# Patient Record
Sex: Male | Born: 1945 | Race: Black or African American | Hispanic: No | Marital: Married | Smoking: Never smoker
Health system: Southern US, Community
[De-identification: ages and names within clinical notes are randomized; demographics above are authoritative.]

## PROBLEM LIST (undated history)

## (undated) DIAGNOSIS — E079 Disorder of thyroid, unspecified: Secondary | ICD-10-CM

## (undated) DIAGNOSIS — I10 Essential (primary) hypertension: Secondary | ICD-10-CM

## (undated) DIAGNOSIS — E119 Type 2 diabetes mellitus without complications: Secondary | ICD-10-CM

---

## 2018-12-03 ENCOUNTER — Ambulatory Visit
Admission: EM | Admit: 2018-12-03 | Discharge: 2018-12-03 | Disposition: A | Discharge status: Home / Self Care | Payer: Medicare Other | Attending: Physician Assistant | Admitting: Physician Assistant

## 2018-12-03 ENCOUNTER — Other Ambulatory Visit: Payer: Self-pay

## 2018-12-03 DIAGNOSIS — S86112A Strain of other muscle(s) and tendon(s) of posterior muscle group at lower leg level, left leg, initial encounter: Secondary | ICD-10-CM

## 2018-12-03 HISTORY — DX: Essential (primary) hypertension: I10

## 2018-12-03 HISTORY — DX: Disorder of thyroid, unspecified: E07.9

## 2018-12-03 HISTORY — DX: Type 2 diabetes mellitus without complications: E11.9

## 2018-12-03 MED ORDER — TIZANIDINE HCL 4 MG PO TABS: 4.0000 mg | ORAL_TABLET | Freq: Two times a day (BID) | ORAL | 0 refills | Status: AC | PRN

## 2018-12-03 MED ORDER — MELOXICAM 7.5 MG PO TABS: 7.5000 mg | ORAL_TABLET | Freq: Every day | ORAL | 0 refills | Status: AC

## 2018-12-03 NOTE — Discharge Instructions (Signed)
Start Mobic. Do not take ibuprofen (motrin/advil)/ naproxen (aleve) while on mobic. Tizanidine as needed, this can make you drowsy, so do not take if you are going to drive, operate heavy machinery, or make important decisions. Ice/heat compresses as needed. This can take up to 3-4 weeks to completely resolve, but you should be feeling better each week. Follow up with PCP if symptoms worsen, changes for reevaluation. If experience numbness/tingling of the inner thighs, loss of bladder or bowel control, go to the emergency department for evaluation. If experiencing swelling to one leg, redness, warmth, increased pain, chest pain, shortness of breath, go to the ED for further evaluation needed.

## 2018-12-03 NOTE — ED Triage Notes (Signed)
Pt c/o lt leg pain x1wk, states started in lower back and moved down lt leg, worse at night. Denies injury. States came from [place] a month ago, 10hr car ride.

## 2018-12-03 NOTE — ED Provider Notes (Signed)
[name]    CSN: [id number] Arrival date & time: 12/03/18  1153     History   Chief Complaint Chief Complaint  Patient presents with  . Leg Pain    HPI Joel Coleman is a 73 y.o. male.   73 year old male with history of diabetes, hypertension, thyroid disease comes in for 1 week history of left leg pain/back pain.  States pain for started to the lower back, this has since resolved.  However, pain traveled to the left hip/thigh area that is worse at nighttime.  Denies injury/trauma, although [place], and has had increase in lifting/strenuous activity.  Denies saddle anesthesia, loss of bladder or bowel control.  Has been taking Tylenol/Aleve without relief.     Past Medical History:  Diagnosis Date  . Diabetes mellitus without complication (HCC)   . Hypertension   . Thyroid disease     There are no active problems to display for this patient.   History reviewed. No pertinent surgical history.     Home Medications    Prior to Admission medications   Medication Sig Start Date End Date Taking? Authorizing Provider  amLODipine (NORVASC) 5 MG tablet Take 5 mg by mouth daily.   Yes [provider]  aspirin EC 81 MG tablet Take 81 mg by mouth daily.   Yes [provider]  atorvastatin (LIPITOR) 20 MG tablet Take 20 mg by mouth daily.   Yes [provider]  benazepril (LOTENSIN) 40 MG tablet Take 40 mg by mouth daily.   Yes [provider]  carvedilol (COREG) 25 MG tablet Take 25 mg by mouth 2 (two) times daily with a meal.   Yes [provider]  glimepiride (AMARYL) 1 MG tablet Take 1 mg by mouth daily with breakfast.   Yes [provider]  hydrALAZINE (APRESOLINE) 50 MG tablet Take 50 mg by mouth 3 (three) times daily.   Yes [provider]  metFORMIN (GLUCOPHAGE) 500 MG tablet Take by mouth.   Yes [provider]  meloxicam (MOBIC) 7.5 MG tablet  Take 1 tablet (7.5 mg total) by mouth daily. 12/03/18   Cathie HoopsYu, Kaleeah Gingerich V, PA-C  tiZANidine (ZANAFLEX) 4 MG tablet Take 1 tablet (4 mg total) by mouth 2 (two) times daily as needed for muscle spasms. 12/03/18   Belinda FisherYu, Dorenda Pfannenstiel V, PA-C    Family History No family history on file.  Social History Social History   Tobacco Use  . Smoking status: Never Smoker  . Smokeless tobacco: Never Used  Substance Use Topics  . Alcohol use: Not Currently  . Drug use: Not on file     Allergies   Patient has no known allergies.   Review of Systems Review of Systems  Reason unable to perform ROS: See HPI as above.     Physical Exam Triage Vital Signs ED Triage Vitals  Enc Vitals Group     BP 12/03/18 1204 (!) 178/104     Pulse Rate 12/03/18 1204 94     Resp 12/03/18 1204 20     Temp 12/03/18 1204 98.2 F (36.8 C)     Temp Source 12/03/18 1204 Oral     SpO2 12/03/18 1204 95 %     Weight --      Height --      Head Circumference --      Peak Flow --      Pain Score 12/03/18 1205 0     Pain  Loc --      Pain Edu? --      Excl. in [place]? --    No data found.  Updated Vital Signs BP (!) 178/104 (BP Location: Left Arm)   Pulse 94   Temp 98.2 F (36.8 C) (Oral)   Resp 20   SpO2 95%   Visual Acuity Right Eye Distance:   Left Eye Distance:   Bilateral Distance:    Right Eye Near:   Left Eye Near:    Bilateral Near:     Physical Exam Constitutional:      General: He is not in acute distress.    Appearance: Normal appearance. He is not ill-appearing, toxic-appearing or diaphoretic.  HENT:     Head: Normocephalic and atraumatic.     Mouth/Throat:     Mouth: Mucous membranes are moist.     Pharynx: Oropharynx is clear. Uvula midline.  Neck:     Musculoskeletal: Normal range of motion and neck supple.  Cardiovascular:     Rate and Rhythm: Normal rate and regular rhythm.     Heart sounds: Normal heart sounds. No murmur. No friction rub. No gallop.   Pulmonary:     Effort: Pulmonary effort  is normal. No accessory muscle usage, prolonged expiration, respiratory distress or retractions.     Comments: Lungs clear to auscultation without adventitious lung sounds. Musculoskeletal:     Right lower leg: No edema.     Left lower leg: No edema.     Comments: No tenderness to palpation of spinous processes, bilateral back.  Mild tenderness to palpation of posterior left hip.  Diffuse tenderness to palpation of posterior left thigh.  Full range of motion of hip, knee, back.  Strength normal and equal bilaterally.  Sensation intact and equal bilaterally.  Negative straight leg raise.  No calf swelling.  Negative Homans.  Skin:    General: Skin is warm and dry.  Neurological:     General: No focal deficit present.     Mental Status: He is alert and oriented to person, place, and time.    UC Treatments / Results  Labs (all labs ordered are listed, but only abnormal results are displayed) Labs Reviewed - No data to display  EKG   Radiology No results found.  Procedures Procedures (including critical care time)  Medications Ordered in UC Medications - No data to display  Initial Impression / Assessment and Plan / UC Course  I have reviewed the triage vital signs and the nursing notes.  Pertinent labs & imaging results that were available during my care of the patient were reviewed by me and considered in my medical decision making (see chart for details).    Start NSAID as directed for pain and inflammation. Muscle relaxant as needed. Ice/heat compresses. Discussed with patient strain can take up to 3-4 weeks to resolve, but should be getting better each week. Return precautions given.   Final Clinical Impressions(s) / UC Diagnoses   Final diagnoses:  Muscle strain of muscle of posterior left lower leg    ED Prescriptions    Medication Sig Dispense Auth. Provider   meloxicam (MOBIC) 7.5 MG tablet Take 1 tablet (7.5 mg total) by mouth daily. 10 tablet Nusaiba Guallpa V, PA-C    tiZANidine (ZANAFLEX) 4 MG tablet Take 1 tablet (4 mg total) by mouth 2 (two) times daily as needed for muscle spasms. 20 tablet Tobin Chad, PA-C 12/03/18  1516  

## 2021-03-23 ENCOUNTER — Other Ambulatory Visit: Payer: Self-pay | Admitting: Family Medicine

## 2021-03-23 DIAGNOSIS — R748 Abnormal levels of other serum enzymes: Secondary | ICD-10-CM

## 2021-04-13 ENCOUNTER — Ambulatory Visit
Admission: RE | Admit: 2021-04-13 | Discharge: 2021-04-13 | Disposition: A | Discharge status: Home / Self Care | Payer: Medicare Other | Source: Ambulatory Visit | Attending: Family Medicine | Admitting: Family Medicine

## 2021-04-13 DIAGNOSIS — R748 Abnormal levels of other serum enzymes: Secondary | ICD-10-CM

## 2021-09-28 ENCOUNTER — Other Ambulatory Visit (HOSPITAL_COMMUNITY): Payer: Self-pay | Admitting: Family Medicine

## 2021-09-28 DIAGNOSIS — R748 Abnormal levels of other serum enzymes: Secondary | ICD-10-CM

## 2021-10-07 ENCOUNTER — Encounter (HOSPITAL_COMMUNITY): Payer: Medicare Other | Attending: Family Medicine

## 2021-10-07 ENCOUNTER — Encounter (HOSPITAL_COMMUNITY): Payer: Self-pay

## 2021-10-07 ENCOUNTER — Encounter (HOSPITAL_COMMUNITY): Payer: Medicare Other

## 2021-11-10 ENCOUNTER — Encounter (HOSPITAL_COMMUNITY)
Admission: RE | Admit: 2021-11-10 | Discharge: 2021-11-10 | Disposition: A | Discharge status: Home / Self Care | Payer: Medicare Other | Source: Ambulatory Visit | Attending: Family Medicine | Admitting: Family Medicine

## 2021-11-10 DIAGNOSIS — R748 Abnormal levels of other serum enzymes: Secondary | ICD-10-CM | POA: Diagnosis not present

## 2021-11-10 MED ORDER — TECHNETIUM TC 99M MEDRONATE IV KIT
20.0000 | PACK | Freq: Once | INTRAVENOUS | Status: AC | PRN
  Administered 2021-11-10: 20 via INTRAVENOUS

## 2021-11-23 ENCOUNTER — Other Ambulatory Visit: Payer: Self-pay | Admitting: Family Medicine

## 2021-11-23 DIAGNOSIS — R634 Abnormal weight loss: Secondary | ICD-10-CM

## 2021-11-23 DIAGNOSIS — R937 Abnormal findings on diagnostic imaging of other parts of musculoskeletal system: Secondary | ICD-10-CM

## 2021-11-26 ENCOUNTER — Ambulatory Visit
Admission: RE | Admit: 2021-11-26 | Discharge: 2021-11-26 | Disposition: A | Discharge status: Home / Self Care | Payer: Medicare Other | Source: Ambulatory Visit | Attending: Family Medicine | Admitting: Family Medicine

## 2021-11-26 DIAGNOSIS — R634 Abnormal weight loss: Secondary | ICD-10-CM

## 2021-11-26 DIAGNOSIS — R937 Abnormal findings on diagnostic imaging of other parts of musculoskeletal system: Secondary | ICD-10-CM

## 2021-11-26 MED ORDER — IOPAMIDOL (ISOVUE-300) INJECTION 61%
100.0000 mL | Freq: Once | INTRAVENOUS | Status: AC | PRN
  Administered 2021-11-26: 100 mL via INTRAVENOUS

## 2023-02-24 ENCOUNTER — Ambulatory Visit: Payer: Medicare Other | Admitting: Podiatry

## 2023-02-24 DIAGNOSIS — B351 Tinea unguium: Secondary | ICD-10-CM | POA: Diagnosis not present

## 2023-02-24 NOTE — Progress Notes (Signed)
Subjective:  Patient ID: Joel Coleman, male    DOB: December 14, 1945,  MRN: [id number]  Chief Complaint  Patient presents with   Diabetes    DFC/ EXAM    Nail Problem    POSS NAIL FUNGUS LEFT HALLUX    77 y.o. male presents with the above complaint.  Patient presents with left hallux thickened onychodystrophy mycotic toenails x 1.  He says been present for quite some time has tried some topical medication and which has helped he like to discuss to clear the nail fungus he has not seen anyone else prior to seeing me for this.  0 out of 10 pain scale   Review of Systems: Negative except as noted in the HPI. Denies N/V/F/Ch.  Past Medical History:  Diagnosis Date   Diabetes mellitus without complication (HCC)    Hypertension    Thyroid disease     Current Outpatient Medications:    terbinafine (LAMISIL) 250 MG tablet, Take 1 tablet (250 mg total) by mouth daily., Disp: 90 tablet, Rfl: 0   amLODipine (NORVASC) 5 MG tablet, Take 5 mg by mouth daily., Disp: , Rfl:    aspirin EC 81 MG tablet, Take 81 mg by mouth daily., Disp: , Rfl:    atorvastatin (LIPITOR) 20 MG tablet, Take 20 mg by mouth daily., Disp: , Rfl:    benazepril (LOTENSIN) 40 MG tablet, Take 40 mg by mouth daily., Disp: , Rfl:    carvedilol (COREG) 25 MG tablet, Take 25 mg by mouth 2 (two) times daily with a meal., Disp: , Rfl:    glimepiride (AMARYL) 1 MG tablet, Take 1 mg by mouth daily with breakfast., Disp: , Rfl:    hydrALAZINE (APRESOLINE) 50 MG tablet, Take 50 mg by mouth 3 (three) times daily., Disp: , Rfl:    meloxicam (MOBIC) 7.5 MG tablet, Take 1 tablet (7.5 mg total) by mouth daily., Disp: 10 tablet, Rfl: 0   metFORMIN (GLUCOPHAGE) 500 MG tablet, Take by mouth., Disp: , Rfl:    terbinafine (LAMISIL) 250 MG tablet, Take 1 tablet (250 mg total) by mouth daily., Disp: 90 tablet, Rfl: 0   tiZANidine (ZANAFLEX) 4 MG tablet, Take 1 tablet (4 mg total) by mouth 2 (two) times daily as needed for muscle spasms., Disp: 20  tablet, Rfl: 0  Social History   Tobacco Use  Smoking Status Never  Smokeless Tobacco Never    No Known Allergies Objective:  There were no vitals filed for this visit. There is no height or weight on file to calculate BMI. Constitutional Well developed. Well nourished.  Vascular Dorsalis pedis pulses palpable bilaterally. Posterior tibial pulses palpable bilaterally. Capillary refill normal to all digits.  No cyanosis or clubbing noted. Pedal hair growth normal.  Neurologic Normal speech. Oriented to person, place, and time. Epicritic sensation to light touch grossly present bilaterally.  Dermatologic Nails left hallux thickened yellow dystrophic mycotic toenails x 1 Skin within normal limits  Orthopedic: Normal joint ROM without pain or crepitus bilaterally. No visible deformities. No bony tenderness.   Radiographs: None Assessment:   1. Nail fungus   2. Onychomycosis due to dermatophyte    Plan:  Patient was evaluated and treated and all questions answered.  Left hallux onychomycosis/nail fungus -Educated the patient on the etiology of onychomycosis and various treatment options associated with improving the fungal load.  I explained to the patient that there is 3 treatment options available to treat the onychomycosis including topical, p.o., laser treatment.  Patient elected to undergo  p.o. options with Lamisil/terbinafine therapy.  In order for me to start the medication therapy, I explained to the patient the importance of evaluating the liver and obtaining the liver function test.  Once the liver function test comes back normal I will start him on 35-month course of Lamisil therapy.  Patient understood all risk and would like to proceed with Lamisil therapy.  I have asked the patient to immediately stop the Lamisil therapy if she has any reactions to it and call the office or go to the emergency room right away.  Patient states understanding   No follow-ups on file.

## 2023-02-25 LAB — HEPATIC FUNCTION PANEL
AG Ratio: 1.4 (calc) (ref 1.0–2.5)
ALT: 9 U/L (ref 9–46)
AST: 17 U/L (ref 10–35)
Albumin: 4.2 g/dL (ref 3.6–5.1)
Alkaline phosphatase (APISO): 179 U/L — ABNORMAL HIGH (ref 35–144)
Bilirubin, Direct: 0.1 mg/dL (ref 0.0–0.2)
Globulin: 3.1 g/dL (ref 1.9–3.7)
Indirect Bilirubin: 0.5 mg/dL (ref 0.2–1.2)
Total Bilirubin: 0.6 mg/dL (ref 0.2–1.2)
Total Protein: 7.3 g/dL (ref 6.1–8.1)

## 2023-02-28 ENCOUNTER — Other Ambulatory Visit: Payer: Self-pay

## 2023-02-28 MED ORDER — TERBINAFINE HCL 250 MG PO TABS: 250.0000 mg | ORAL_TABLET | Freq: Every day | ORAL | 0 refills | Status: AC

## 2023-05-23 ENCOUNTER — Ambulatory Visit
Admission: RE | Admit: 2023-05-23 | Discharge: 2023-05-23 | Disposition: A | Discharge status: Home / Self Care | Payer: Medicare Other | Source: Ambulatory Visit | Attending: Family Medicine | Admitting: Family Medicine

## 2023-05-23 VITALS — BP 122/67 | HR 73 | Temp 98.1°F | Resp 18

## 2023-05-23 DIAGNOSIS — J22 Unspecified acute lower respiratory infection: Secondary | ICD-10-CM

## 2023-05-23 MED ORDER — AMOXICILLIN-POT CLAVULANATE 875-125 MG PO TABS: 1.0000 | ORAL_TABLET | Freq: Two times a day (BID) | ORAL | 0 refills | Status: AC

## 2023-05-23 NOTE — ED Triage Notes (Signed)
 Sx x 2 weeks  Productive cough with yellow mucus  No fever but had chills a few day ago.

## 2023-05-23 NOTE — ED Provider Notes (Signed)
 [facility]    CSN: [id number] Arrival date & time: 05/23/23  9047      History   Chief Complaint Chief Complaint  Patient presents with   Cough    COUGHING OVER 2 WEEKSCONGESTION - Entered by patient    HPI Joel Coleman is a 78 y.o. male.  Patient here with 2 weeks coughs,  no fever, chills, or body aches. Endorses nasal drainage, throat irritation, and runny nose. Taking Mucinex without improvement of symptoms.  No history of chronic lung disease. Wife has same symptoms. Past Medical History:  Diagnosis Date   Diabetes mellitus without complication (HCC)    Hypertension    Thyroid disease     There are no active problems to display for this patient.   History reviewed. No pertinent surgical history.     Home Medications    Prior to Admission medications   Medication Sig Start Date End Date Taking? Authorizing Provider  amLODipine (NORVASC) 5 MG tablet Take 5 mg by mouth daily.   Yes [provider]  amoxicillin -clavulanate (AUGMENTIN ) 875-125 MG tablet Take 1 tablet by mouth every 12 (twelve) hours for 10 days. 05/23/23 06/02/23 Yes Arloa Suzen RAMAN, NP  aspirin EC 81 MG tablet Take 81 mg by mouth daily.   Yes [provider]  atorvastatin (LIPITOR) 20 MG tablet Take 20 mg by mouth daily.   Yes [provider]  benazepril (LOTENSIN) 40 MG tablet Take 40 mg by mouth daily.   Yes [provider]  carvedilol (COREG) 25 MG tablet Take 25 mg by mouth 2 (two) times daily with a meal.   Yes [provider]  glimepiride (AMARYL) 1 MG tablet Take 1 mg by mouth daily with breakfast.   Yes [provider]  hydrALAZINE (APRESOLINE) 50 MG tablet Take 50 mg by mouth 3 (three) times daily.   Yes [provider]  meloxicam  (MOBIC ) 7.5 MG tablet Take 1 tablet (7.5 mg total) by mouth daily. 12/03/18  Yes Yu, Amy V, PA-C  metFORMIN (GLUCOPHAGE) 500 MG tablet Take by mouth.   Yes [provider]   terbinafine  (LAMISIL ) 250 MG tablet Take 1 tablet (250 mg total) by mouth daily. 02/28/23 05/29/23 Yes Tobie Franky SQUIBB, DPM  terbinafine  (LAMISIL ) 250 MG tablet Take 1 tablet (250 mg total) by mouth daily. 02/28/23  Yes Tobie Franky SQUIBB, DPM  tiZANidine  (ZANAFLEX ) 4 MG tablet Take 1 tablet (4 mg total) by mouth 2 (two) times daily as needed for muscle spasms. 12/03/18  Yes Babara Greig GAILS, PA-C    Family History History reviewed. No pertinent family history.  Social History Social History   Tobacco Use   Smoking status: Never   Smokeless tobacco: Never  Substance Use Topics   Alcohol use: Not Currently     Allergies   Patient has no known allergies.   Review of Systems Review of Systems  Respiratory:  Positive for cough.      Physical Exam Triage Vital Signs ED Triage Vitals  Encounter Vitals Group     BP 05/23/23 1024 122/67     Systolic BP Percentile --      Diastolic BP Percentile --      Pulse Rate 05/23/23 1024 73     Resp 05/23/23 1024 18     Temp 05/23/23 1024 98.1 F (36.7 C)     Temp Source 05/23/23 1024 Oral     SpO2 05/23/23 1024 97 %     Weight --  Height --      Head Circumference --      Peak Flow --      Pain Score 05/23/23 1022 0     Pain Loc --      Pain Education --      Exclude from Growth Chart --    No data found.  Updated Vital Signs BP 122/67 (BP Location: Right Arm)   Pulse 73   Temp 98.1 F (36.7 C) (Oral)   Resp 18   SpO2 97%   Visual Acuity Right Eye Distance:   Left Eye Distance:   Bilateral Distance:    Right Eye Near:   Left Eye Near:    Bilateral Near:     Physical Exam Constitutional:      Appearance: He is well-developed.  HENT:     Head: Normocephalic and atraumatic.     Right Ear: Tympanic membrane, ear canal and external ear normal.     Left Ear: Tympanic membrane and external ear normal.     Nose: Congestion and rhinorrhea present.     Mouth/Throat:     Pharynx: Postnasal drip present.  Eyes:      Conjunctiva/sclera: Conjunctivae normal.     Pupils: Pupils are equal, round, and reactive to light.  Neck:     Thyroid: No thyromegaly.     Trachea: No tracheal deviation.  Cardiovascular:     Rate and Rhythm: Normal rate and regular rhythm.     Heart sounds: Normal heart sounds.  Pulmonary:     Effort: Pulmonary effort is normal.     Breath sounds: Rhonchi present.  Musculoskeletal:        General: Normal range of motion.     Cervical back: Normal range of motion and neck supple.  Skin:    General: Skin is warm and dry.  Neurological:     General: No focal deficit present.     Mental Status: He is alert and oriented to person, place, and time.    UC Treatments / Results  Labs (all labs ordered are listed, but only abnormal results are displayed) Labs Reviewed - No data to display  EKG   Radiology No results found.  Procedures Procedures (including critical care time)  Medications Ordered in UC Medications - No data to display  Initial Impression / Assessment and Plan / UC Course  I have reviewed the triage vital signs and the nursing notes.  Pertinent labs & imaging results that were available during my care of the patient were reviewed by me and considered in my medical decision making (see chart for details).    1. Lower respiratory infection (Primary) - amoxicillin -clavulanate (AUGMENTIN ) 875-125 MG tablet; Take 1 tablet by mouth every 12 (twelve) hours for 10 days.  Dispense: 20 tablet; Refill: 0  Return precautions given if symptoms worsen or do not improve. Final Clinical Impressions(s) / UC Diagnoses   Final diagnoses:  Lower respiratory infection   Discharge Instructions   None    ED Prescriptions     Medication Sig Dispense Auth. Provider   amoxicillin -clavulanate (AUGMENTIN ) 875-125 MG tablet Take 1 tablet by mouth every 12 (twelve) hours for 10 days. 20 tablet Arloa Suzen RAMAN, NP      PDMP not reviewed this encounter.   Arloa Suzen RAMAN, NP 05/23/23 [phone]

## 2023-06-28 IMAGING — US US ABDOMEN LIMITED
1 series · 14 of 25 positions shown · non-contrast
Comparison: None.

CLINICAL DATA: Elevated PETER.

EXAM:
ULTRASOUND ABDOMEN LIMITED RIGHT UPPER QUADRANT

[Series 1: us abdomen limited · 0.25mm/px · 14 of 40 slices shown]
[im 1/40]
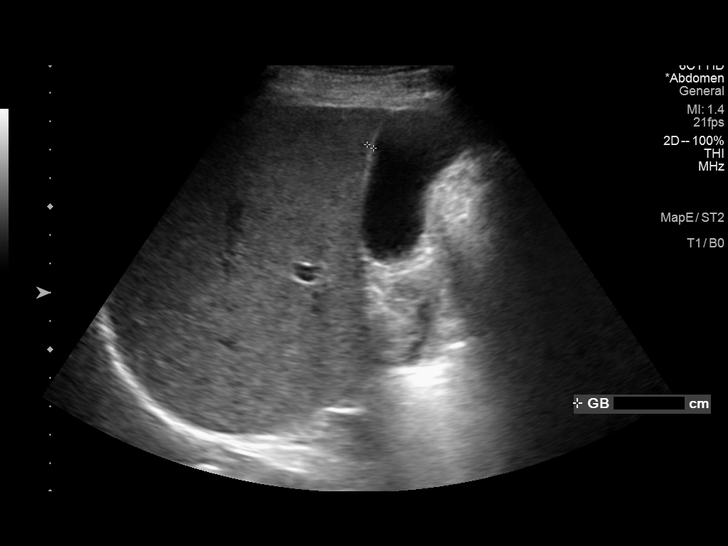
[im 4/40]
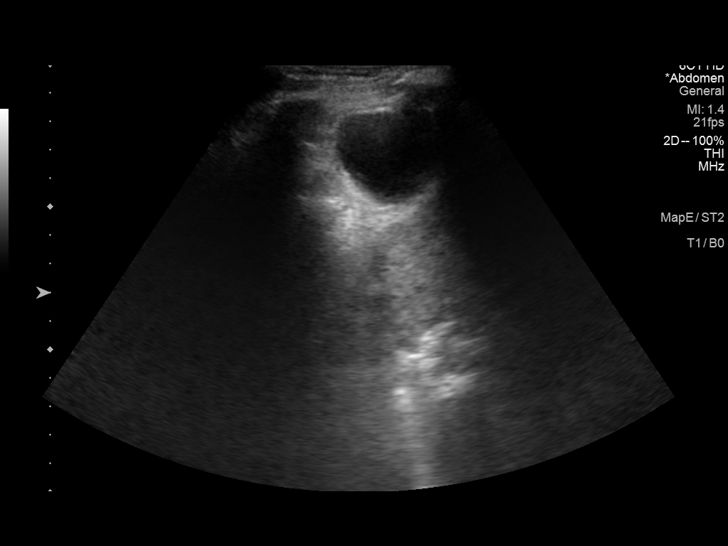
[im 7/40]
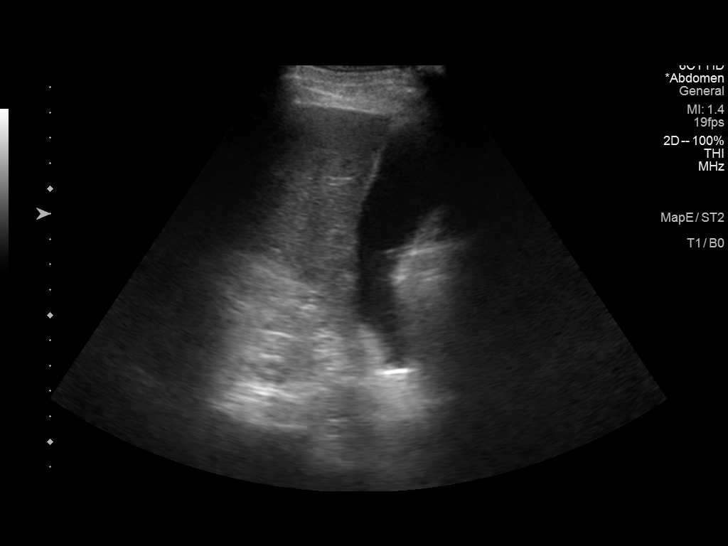
[im 10/40]
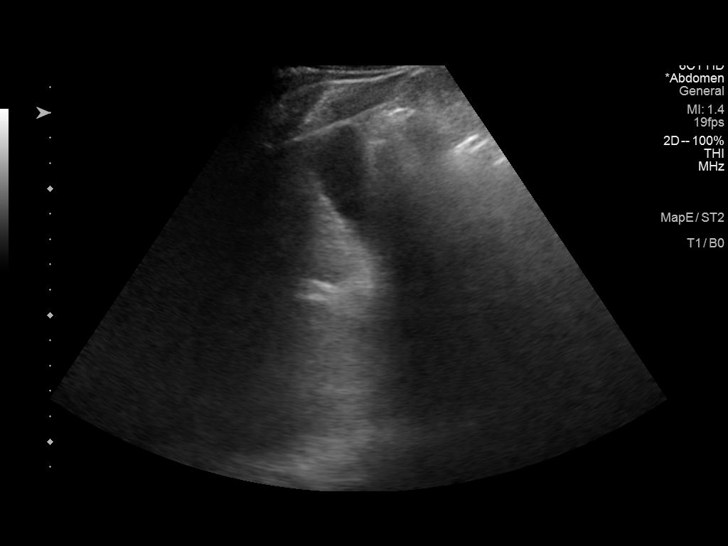
[im 14/40]
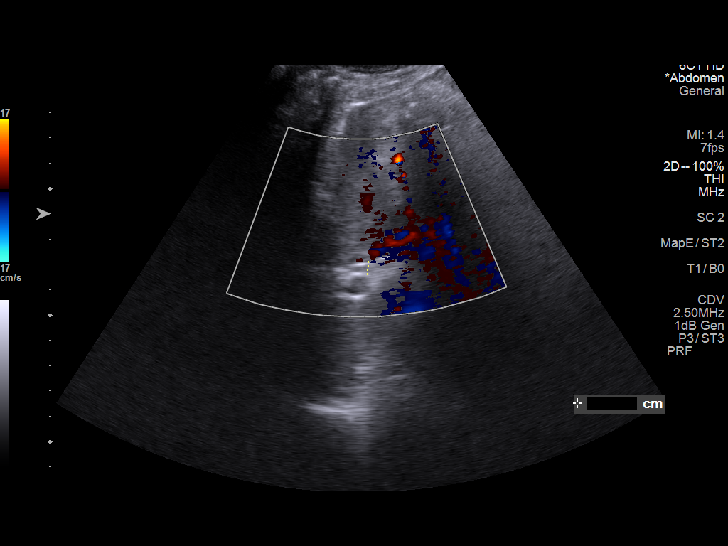
[im 15/40]
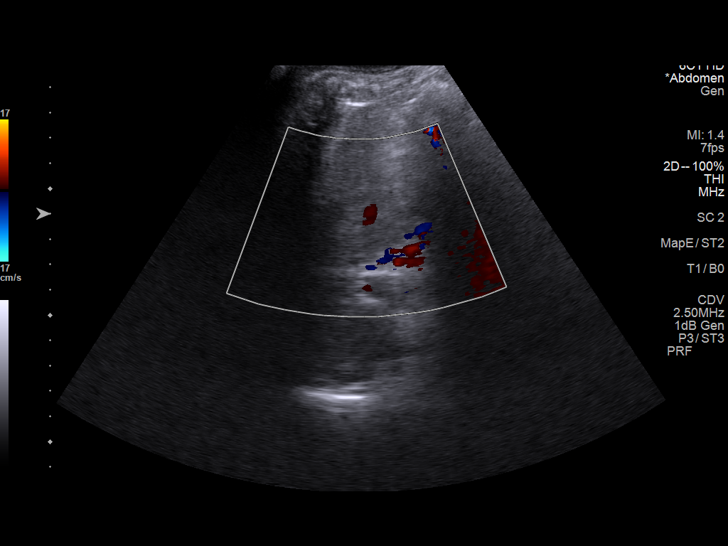
[im 18/40]
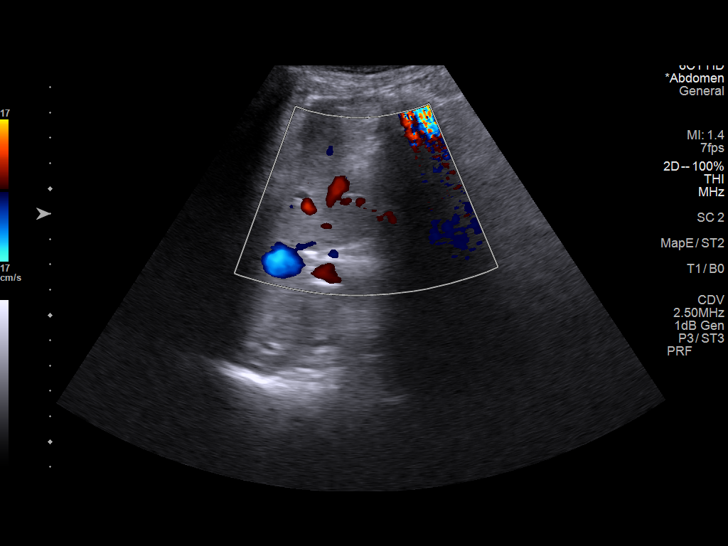
[im 22/40]
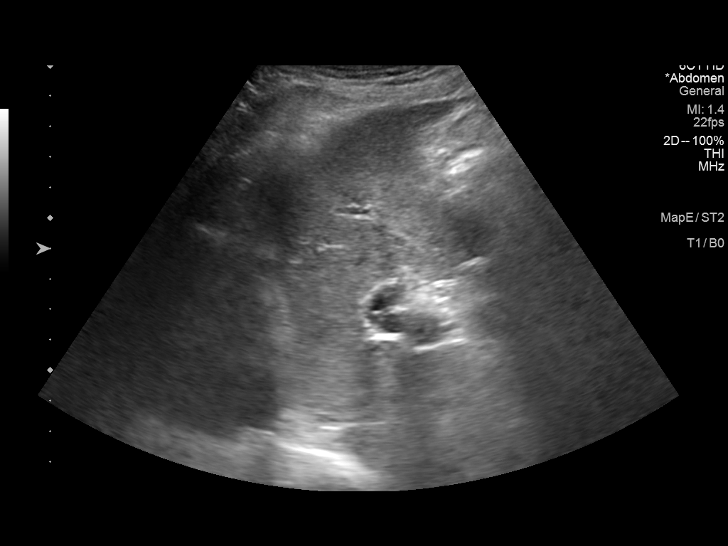
[im 25/40]
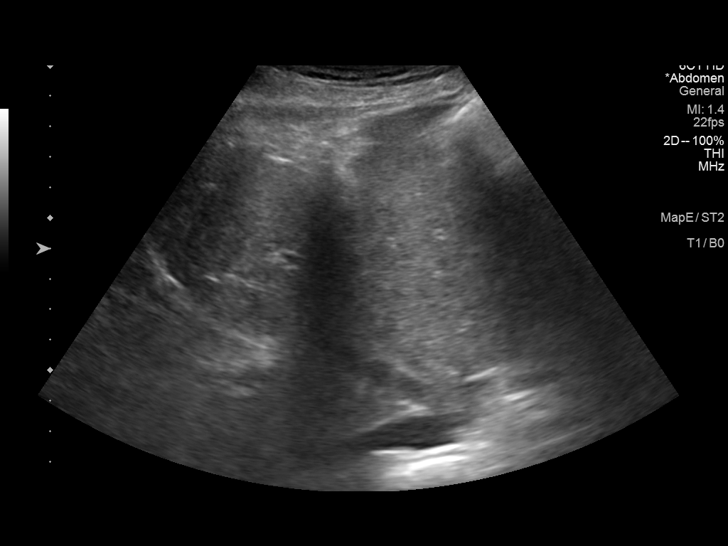
[im 27/40]
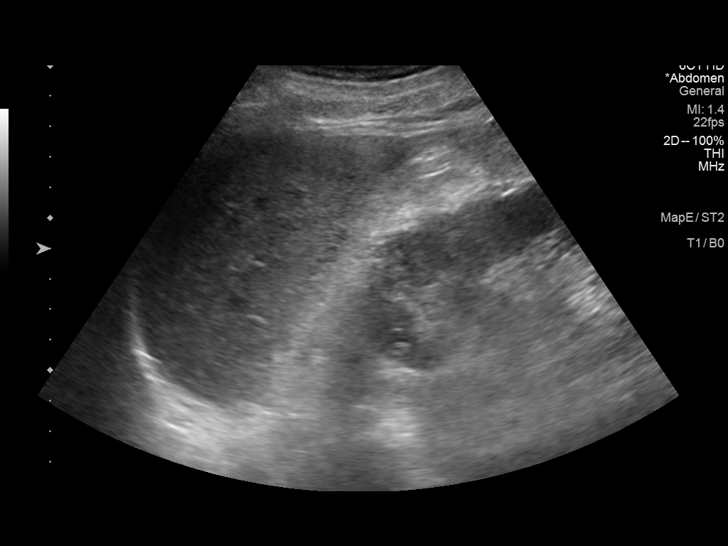
[im 30/40]
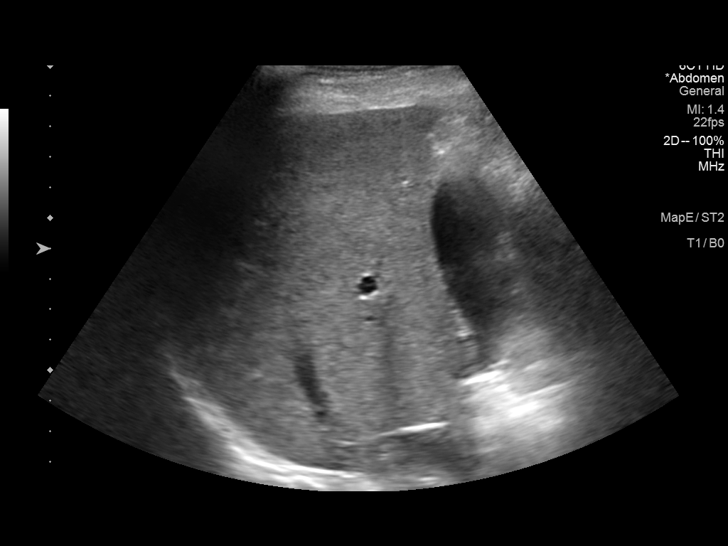
[im 33/40]
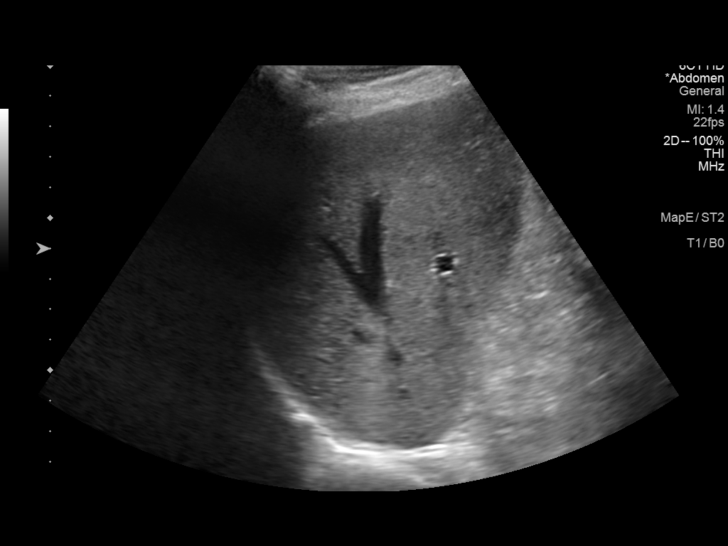
[im 36/40]
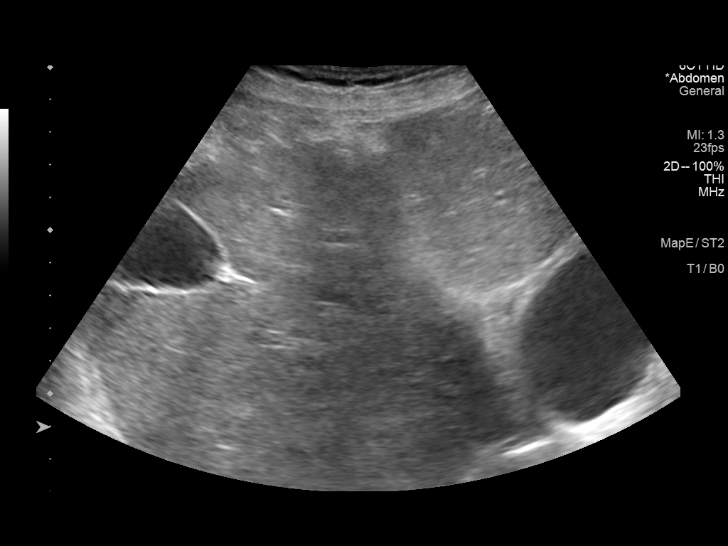
[im 40/40]
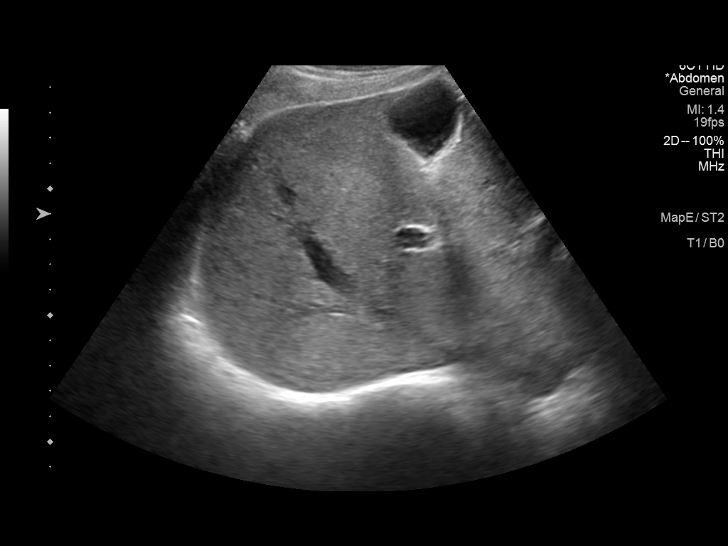

[14 of 25 positions shown; findings below may reference images not displayed]

FINDINGS: Suboptimal evaluation secondary to shadowing from overlying bowel
gas.

Gallbladder:

No gallstones or wall thickening visualized. No sonographic Murphy
sign noted by sonographer.

Common bile duct:

Diameter: 0.3 cm

Liver:

No focal lesion identified. Increased hepatic parenchymal
echogenicity. Portal vein is patent on color Doppler imaging with
normal direction of blood flow towards the liver.

Other: No perihepatic ascites.
IMPRESSION: Echogenic liver. Findings most commonly seen in hepatic steatosis,
though may also represent hepatitis and/or fibrosis.

## 2023-06-30 ENCOUNTER — Ambulatory Visit (INDEPENDENT_AMBULATORY_CARE_PROVIDER_SITE_OTHER): Payer: Medicare Other | Admitting: Podiatry

## 2023-06-30 ENCOUNTER — Encounter: Payer: Self-pay | Admitting: Podiatry

## 2023-06-30 DIAGNOSIS — Z79899 Other long term (current) drug therapy: Secondary | ICD-10-CM

## 2023-06-30 DIAGNOSIS — B351 Tinea unguium: Secondary | ICD-10-CM | POA: Diagnosis not present

## 2023-06-30 NOTE — Progress Notes (Signed)
Subjective:  Patient ID: Joel Coleman, male    DOB: [birth year],  MRN: [id number]  Chief Complaint  Patient presents with   Nail Problem    Pt is here to f/u on bilateral toenail fungus and would like his toenails cut.    78 y.o. male presents with the above complaint.  Patient presents with left hallux thickened onychodystrophy mycotic toenails x 1.  He states he was able to tolerate the medication denies any other acute complaints.  Left to do another round it has definitely helped cleared up.   Review of Systems: Negative except as noted in the HPI. Denies N/V/F/Ch.  Past Medical History:  Diagnosis Date   Diabetes mellitus without complication (HCC)    Hypertension    Thyroid disease     Current Outpatient Medications:    amLODipine (NORVASC) 5 MG tablet, Take 5 mg by mouth daily., Disp: , Rfl:    aspirin EC 81 MG tablet, Take 81 mg by mouth daily., Disp: , Rfl:    atorvastatin (LIPITOR) 20 MG tablet, Take 20 mg by mouth daily., Disp: , Rfl:    benazepril (LOTENSIN) 40 MG tablet, Take 40 mg by mouth daily., Disp: , Rfl:    carvedilol (COREG) 25 MG tablet, Take 25 mg by mouth 2 (two) times daily with a meal., Disp: , Rfl:    glimepiride (AMARYL) 1 MG tablet, Take 1 mg by mouth daily with breakfast., Disp: , Rfl:    hydrALAZINE (APRESOLINE) 50 MG tablet, Take 50 mg by mouth 3 (three) times daily., Disp: , Rfl:    meloxicam (MOBIC) 7.5 MG tablet, Take 1 tablet (7.5 mg total) by mouth daily., Disp: 10 tablet, Rfl: 0   metFORMIN (GLUCOPHAGE) 500 MG tablet, Take by mouth., Disp: , Rfl:    terbinafine (LAMISIL) 250 MG tablet, Take 1 tablet (250 mg total) by mouth daily., Disp: 90 tablet, Rfl: 0   tiZANidine (ZANAFLEX) 4 MG tablet, Take 1 tablet (4 mg total) by mouth 2 (two) times daily as needed for muscle spasms., Disp: 20 tablet, Rfl: 0  Social History   Tobacco Use  Smoking Status Never  Smokeless Tobacco Never    No Known Allergies Objective:  There were no vitals filed  for this visit. There is no height or weight on file to calculate BMI. Constitutional Well developed. Well nourished.  Vascular Dorsalis pedis pulses palpable bilaterally. Posterior tibial pulses palpable bilaterally. Capillary refill normal to all digits.  No cyanosis or clubbing noted. Pedal hair growth normal.  Neurologic Normal speech. Oriented to person, place, and time. Epicritic sensation to light touch grossly present bilaterally.  Dermatologic Nails left hallux thickened yellow dystrophic mycotic toenails x 1 Skin within normal limits  Orthopedic: Normal joint ROM without pain or crepitus bilaterally. No visible deformities. No bony tenderness.   Radiographs: None Assessment:   1. High risk medication use    Plan:  Patient was evaluated and treated and all questions answered.  Left hallux onychomycosis/nail fungus~second  round -Educated the patient on the etiology of onychomycosis and various treatment options associated with improving the fungal load.  I explained to the patient that there is 3 treatment options available to treat the onychomycosis including topical, p.o., laser treatment.  Patient elected to undergo p.o. options with Lamisil/terbinafine therapy.  In order for me to start the medication therapy, I explained to the patient the importance of evaluating the liver and obtaining the liver function test.  Once the liver function test comes back normal I  will start him on 31-month course of Lamisil therapy.  Patient understood all risk and would like to proceed with Lamisil therapy.  I have asked the patient to immediately stop the Lamisil therapy if she has any reactions to it and call the office or go to the emergency room right away.  Patient states understanding   No follow-ups on file.

## 2023-07-08 LAB — HEPATIC FUNCTION PANEL
AG Ratio: 1.4 (calc) (ref 1.0–2.5)
ALT: 11 U/L (ref 9–46)
AST: 18 U/L (ref 10–35)
Albumin: 4.1 g/dL (ref 3.6–5.1)
Alkaline phosphatase (APISO): 172 U/L — ABNORMAL HIGH (ref 35–144)
Bilirubin, Direct: 0.1 mg/dL (ref 0.0–0.2)
Globulin: 3 g/dL (ref 1.9–3.7)
Indirect Bilirubin: 0.3 mg/dL (ref 0.2–1.2)
Total Bilirubin: 0.4 mg/dL (ref 0.2–1.2)
Total Protein: 7.1 g/dL (ref 6.1–8.1)

## 2023-07-11 MED ORDER — TERBINAFINE HCL 250 MG PO TABS: 250.0000 mg | ORAL_TABLET | Freq: Every day | ORAL | 0 refills | Status: AC

## 2023-07-11 NOTE — Addendum Note (Signed)
 Addended by: Nicholes Rough on: 07/11/2023 08:12 AM   Modules accepted: Orders
